# Patient Record
Sex: Female | Born: 1995 | Race: Black or African American | Hispanic: No | Marital: Single | Smoking: Never smoker
Health system: Southern US, Community
[De-identification: ages and names within clinical notes are randomized; demographics above are authoritative.]

---

## 2018-09-04 ENCOUNTER — Ambulatory Visit: Payer: Self-pay

## 2018-09-08 ENCOUNTER — Encounter: Payer: Medicaid Other | Admitting: Obstetrics and Gynecology

## 2018-09-10 ENCOUNTER — Telehealth: Payer: Self-pay | Admitting: Obstetrics and Gynecology

## 2018-09-10 NOTE — Telephone Encounter (Signed)
We received record for patient to est care. Patient was schedule 09/08/18 with [facility] and patient no showed appointment. I Called and left voicemail for patient to call back to be schedule. Paper records

## 2018-09-14 NOTE — Telephone Encounter (Signed)
Attempt to schedule appointment for patient. Patient wishes to call back to be schedule

## 2018-10-02 ENCOUNTER — Encounter: Payer: Self-pay | Admitting: Advanced Practice Midwife

## 2018-10-02 ENCOUNTER — Ambulatory Visit (INDEPENDENT_AMBULATORY_CARE_PROVIDER_SITE_OTHER): Payer: Medicaid Other | Admitting: Advanced Practice Midwife

## 2018-10-02 ENCOUNTER — Other Ambulatory Visit (HOSPITAL_COMMUNITY)
Admission: RE | Admit: 2018-10-02 | Discharge: 2018-10-02 | Disposition: A | Discharge status: Home / Self Care | Payer: Self-pay | Source: Ambulatory Visit | Attending: Advanced Practice Midwife | Admitting: Advanced Practice Midwife

## 2018-10-02 VITALS — BP 110/60 | Ht 64.0 in | Wt 166.0 lb

## 2018-10-02 DIAGNOSIS — Z113 Encounter for screening for infections with a predominantly sexual mode of transmission: Secondary | ICD-10-CM | POA: Diagnosis not present

## 2018-10-02 DIAGNOSIS — Z3202 Encounter for pregnancy test, result negative: Secondary | ICD-10-CM | POA: Diagnosis not present

## 2018-10-02 DIAGNOSIS — Z30013 Encounter for initial prescription of injectable contraceptive: Secondary | ICD-10-CM | POA: Diagnosis not present

## 2018-10-02 DIAGNOSIS — Z30431 Encounter for routine checking of intrauterine contraceptive device: Secondary | ICD-10-CM

## 2018-10-02 LAB — POCT URINE PREGNANCY: PREG TEST UR: NEGATIVE

## 2018-10-02 MED ORDER — MEDROXYPROGESTERONE ACETATE 150 MG/ML IM SUSP: 150.0000 mg | INTRAMUSCULAR | 3 refills | Status: DC

## 2018-10-02 MED ORDER — MISOPROSTOL 200 MCG PO TABS: ORAL_TABLET | ORAL | 0 refills | Status: DC

## 2018-10-02 NOTE — Progress Notes (Signed)
   Patient ID: Allison Mueller, female   DOB: 1996/07/10, 22 y.o.   MRN: [id number]  Reason for Visit: Contraception (depo)    Subjective:     HPI:  Allison Mueller is a 22 y.o. female G2 P1011 in the office for birth control consult and establishment of care. She has recently moved here from [place]. Previously she took birth control pills and became pregnant for a second time. She had that pregnancy terminated. She then started Depo although admits missing her most recent dose in October. She does not think she is pregnant at this time but has been sexually active without protection since missing her last dose of Depo. She admits an annual exam in the past few months. She admits STD testing in the last 2 years. Today she is counseled as to the risks/benefits of other methods of birth control including LARCS. She is interested in an IUD but will need to discuss this with her fiance prior to having the procedure. Rx sent for Depo in case she chooses that. Also sent Rx for cytotec to take prior to procedure if she chooses the IUD.   History reviewed. No pertinent past medical history. Family History  Problem Relation Age of Onset  . Cancer Paternal Uncle   . Breast cancer Maternal Grandmother    History reviewed. No pertinent surgical history.  Short Social History:  Social History   Tobacco Use  . Smoking status: Never Smoker  . Smokeless tobacco: Never Used  Substance Use Topics  . Alcohol use: Never    Frequency: Never    No Known Allergies  Current Outpatient Medications  Medication Sig Dispense Refill  . medroxyPROGESTERone (DEPO-PROVERA) 150 MG/ML injection Inject 1 mL (150 mg total) into the muscle every 3 (three) months. 1 mL 3  . misoprostol (CYTOTEC) 200 MCG tablet Place two tablets in between your gums and cheeks (one tablet on each side) 4 hours prior to procedure 2 tablet 0   No current facility-administered medications for this visit.     Review of Systems    Constitutional:  Constitutional negative. HENT: HENT negative.  Eyes: Eyes negative.  Respiratory: Respiratory negative.  Cardiovascular: Cardiovascular negative.  GI: Gastrointestinal negative.  GU: Genitourinary negative. Musculoskeletal: Musculoskeletal negative.  Skin: Skin negative.  Neurological: Neurological negative. Hematologic: Hematologic/lymphatic negative.  Psychiatric: Psychiatric negative.        Objective:  Objective   Vitals:   10/02/18 0954  BP: 110/60  Weight: 166 lb (75.3 kg)  Height: 5\' 4"  (1.626 m)   Body mass index is 28.49 kg/m.  Vital Signs: BP 110/60   Ht 5\' 4"  (1.626 m)   Wt 166 lb (75.3 kg)   BMI 28.49 kg/m  Constitutional: Well nourished, well developed female in no acute distress.  HEENT: normal Skin: Warm and dry.   Respiratory:  Normal respiratory effort Psych: Alert and Oriented x3. No memory deficits. Normal mood and affect.    Data: POCT urine pregnancy: negative     Assessment/Plan:     22 yo G2 P1011 female in the office for birth control consultation  Urine aptima sent Return to office in the next week for either Depo injection or IUD as desired   Tresea Mall CNM [facility], [facility]

## 2018-10-06 LAB — CERVICOVAGINAL ANCILLARY ONLY
CHLAMYDIA, DNA PROBE: POSITIVE — AB
NEISSERIA GONORRHEA: POSITIVE — AB
Trichomonas: NEGATIVE

## 2018-10-07 ENCOUNTER — Other Ambulatory Visit: Payer: Self-pay | Admitting: Advanced Practice Midwife

## 2018-10-07 DIAGNOSIS — A549 Gonococcal infection, unspecified: Secondary | ICD-10-CM

## 2018-10-07 DIAGNOSIS — A749 Chlamydial infection, unspecified: Secondary | ICD-10-CM

## 2018-10-07 MED ORDER — AZITHROMYCIN 500 MG PO TABS: 1000.0000 mg | ORAL_TABLET | Freq: Once | ORAL | 0 refills | Status: AC

## 2018-10-07 MED ORDER — CEFTRIAXONE SODIUM 250 MG IJ SOLR: 250.0000 mg | Freq: Once | INTRAMUSCULAR | Status: DC

## 2018-10-07 NOTE — Progress Notes (Signed)
Rx sent for Azithromycin to patient pharmacy. Rx ordered for clinic administered Rocephin. Patient notified. She has lost her medicaid card and will pick up her medicine and come to clinic for injection when she finds her card. She has decided to continue with Depo for birth control.

## 2018-10-19 ENCOUNTER — Telehealth: Payer: Self-pay

## 2018-10-19 NOTE — Telephone Encounter (Signed)
Pt called to sched appt for bc and was told she'd need a preg test. Please call.  She was just seen a couple weeks ago.  [phone]

## 2018-10-20 NOTE — Telephone Encounter (Signed)
Pt aware that we would still have to do a pregnancy test since she is late getting her depo injection, we have to do this to cover ourselves and document everything. Pt states she will try to come in December but cannot come yet since she would have to pay $16 for pregnancy test. I advised her we will still have to do preg test in December.

## 2019-04-19 ENCOUNTER — Emergency Department: Payer: Medicaid Other

## 2019-04-19 ENCOUNTER — Emergency Department
Admission: EM | Admit: 2019-04-19 | Discharge: 2019-04-19 | Disposition: A | Discharge status: Home / Self Care | Payer: Medicaid Other | Attending: Emergency Medicine | Admitting: Emergency Medicine

## 2019-04-19 ENCOUNTER — Encounter: Payer: Self-pay | Admitting: Emergency Medicine

## 2019-04-19 ENCOUNTER — Other Ambulatory Visit: Payer: Self-pay

## 2019-04-19 DIAGNOSIS — R109 Unspecified abdominal pain: Secondary | ICD-10-CM | POA: Diagnosis present

## 2019-04-19 DIAGNOSIS — Z79899 Other long term (current) drug therapy: Secondary | ICD-10-CM | POA: Insufficient documentation

## 2019-04-19 LAB — URINALYSIS, COMPLETE (UACMP) WITH MICROSCOPIC
Bacteria, UA: NONE SEEN
Bilirubin Urine: NEGATIVE
Glucose, UA: NEGATIVE mg/dL
Hgb urine dipstick: NEGATIVE
Ketones, ur: NEGATIVE mg/dL
Nitrite: NEGATIVE
Protein, ur: NEGATIVE mg/dL
Specific Gravity, Urine: 1.031 — ABNORMAL HIGH (ref 1.005–1.030)
pH: 5 (ref 5.0–8.0)

## 2019-04-19 LAB — COMPREHENSIVE METABOLIC PANEL
ALT: 41 U/L (ref 0–44)
AST: 36 U/L (ref 15–41)
Albumin: 3.8 g/dL (ref 3.5–5.0)
Alkaline Phosphatase: 84 U/L (ref 38–126)
Anion gap: 8 (ref 5–15)
BUN: 10 mg/dL (ref 6–20)
CO2: 22 mmol/L (ref 22–32)
Calcium: 9.1 mg/dL (ref 8.9–10.3)
Chloride: 108 mmol/L (ref 98–111)
Creatinine, Ser: 0.69 mg/dL (ref 0.44–1.00)
GFR calc Af Amer: >60 mL/min (ref >60)
GFR calc non Af Amer: >60 mL/min (ref >60)
Glucose, Bld: 118 mg/dL — ABNORMAL HIGH (ref 70–99)
Potassium: 3.7 mmol/L (ref 3.5–5.1)
Sodium: 138 mmol/L (ref 135–145)
Total Bilirubin: 0.8 mg/dL (ref 0.3–1.2)
Total Protein: 7.3 g/dL (ref 6.5–8.1)

## 2019-04-19 LAB — CBC WITH DIFFERENTIAL/PLATELET
Abs Immature Granulocytes: 0.04 10*3/uL (ref 0.00–0.07)
Basophils Absolute: 0 10*3/uL (ref 0.0–0.1)
Basophils Relative: 0 %
Eosinophils Absolute: 0.1 10*3/uL (ref 0.0–0.5)
Eosinophils Relative: 1 %
HCT: 37.9 % (ref 36.0–46.0)
Hemoglobin: 13.4 g/dL (ref 12.0–15.0)
Immature Granulocytes: 0 %
Lymphocytes Relative: 18 %
Lymphs Abs: 1.8 10*3/uL (ref 0.7–4.0)
MCH: 29 pg (ref 26.0–34.0)
MCHC: 35.4 g/dL (ref 30.0–36.0)
MCV: 82 fL (ref 80.0–100.0)
Monocytes Absolute: 0.5 10*3/uL (ref 0.1–1.0)
Monocytes Relative: 5 %
Neutro Abs: 7.4 10*3/uL (ref 1.7–7.7)
Neutrophils Relative %: 76 %
Platelets: 282 10*3/uL (ref 150–400)
RBC: 4.62 MIL/uL (ref 3.87–5.11)
RDW: 12.5 % (ref 11.5–15.5)
WBC: 9.9 10*3/uL (ref 4.0–10.5)
nRBC: 0 % (ref 0.0–0.2)

## 2019-04-19 LAB — POCT PREGNANCY, URINE: Preg Test, Ur: NEGATIVE

## 2019-04-19 LAB — LIPASE, BLOOD: Lipase: 25 U/L (ref 11–51)

## 2019-04-19 LAB — FIBRIN DERIVATIVES D-DIMER (ARMC ONLY): Fibrin derivatives D-dimer (ARMC): 474.48 ng/mL (FEU) (ref 0.00–499.00)

## 2019-04-19 MED ORDER — IBUPROFEN 600 MG PO TABS: 600.0000 mg | ORAL_TABLET | Freq: Four times a day (QID) | ORAL | 0 refills | Status: DC | PRN

## 2019-04-19 MED ORDER — CYCLOBENZAPRINE HCL 5 MG PO TABS: 5.0000 mg | ORAL_TABLET | Freq: Three times a day (TID) | ORAL | 0 refills | Status: DC | PRN

## 2019-04-19 NOTE — ED Triage Notes (Signed)
Pt reports pain to left flank when she breathes. Pt states started Saturday, denies all other sx'x.

## 2019-04-19 NOTE — ED Provider Notes (Signed)
[facility] Emergency Department Provider Note ____________________________________________   First MD Initiated Contact with Patient 04/19/19 [id number]     (approximate)  I have reviewed the triage vital signs and the nursing notes.   HISTORY  Chief Complaint Flank Pain    HPI Allison Mueller is a 23 y.o. female with no significant PMH who presents with left flank pain, gradual onset over the last 2 days, worse with certain positions, and occasionally radiating to her left shoulder.  She states that it is also worse when she takes a deep breath, but she does not feel short of breath.  She denies any associated nausea, vomiting, diarrhea, fever, cough, urinary symptoms, or any vaginal bleeding or discharge.  She states she had this pain once before and it resolved on its own.  History reviewed. No pertinent past medical history.  There are no active problems to display for this patient.   History reviewed. No pertinent surgical history.  Prior to Admission medications   Medication Sig Start Date End Date Taking? Authorizing Provider  cyclobenzaprine (FLEXERIL) 5 MG tablet Take 1 tablet (5 mg total) by mouth 3 (three) times daily as needed for muscle spasms. 04/19/19   Dionne BucySiadecki, Emmory Solivan, MD  ibuprofen (ADVIL) 600 MG tablet Take 1 tablet (600 mg total) by mouth every 6 (six) hours as needed. 04/19/19   Dionne BucySiadecki, Zanita Millman, MD  medroxyPROGESTERone (DEPO-PROVERA) 150 MG/ML injection Inject 1 mL (150 mg total) into the muscle every 3 (three) months. 10/02/18   Tresea MallGledhill, Jane, CNM  misoprostol (CYTOTEC) 200 MCG tablet Place two tablets in between your gums and cheeks (one tablet on each side) 4 hours prior to procedure 10/02/18   Tresea MallGledhill, Jane, CNM    Allergies Patient has no known allergies.  Family History  Problem Relation Age of Onset  . Cancer Paternal Uncle   . Breast cancer Maternal Grandmother     Social History Social History   Tobacco Use  . Smoking  status: Never Smoker  . Smokeless tobacco: Never Used  Substance Use Topics  . Alcohol use: Never    Frequency: Never  . Drug use: Never    Review of Systems  Constitutional: No fever. Eyes: No redness. ENT: No sore throat. Cardiovascular: Denies chest pain. Respiratory: Denies shortness of breath. Gastrointestinal: No vomiting or diarrhea.  Genitourinary: Negative for dysuria.  Musculoskeletal: Negative for back pain. Skin: Negative for rash. Neurological: Negative for headache.   ____________________________________________   PHYSICAL EXAM:  VITAL SIGNS: ED Triage Vitals  Enc Vitals Group     BP 04/19/19 0903 (!) 146/88     Pulse Rate 04/19/19 0903 (!) 109     Resp 04/19/19 0903 18     Temp 04/19/19 0903 99 F (37.2 C)     Temp Source 04/19/19 0903 Oral     SpO2 04/19/19 0903 100 %     Weight 04/19/19 0900 127 lb (57.6 kg)     Height 04/19/19 0900 5\' 7"  (1.702 m)     Head Circumference --      Peak Flow --      Pain Score 04/19/19 0900 7     Pain Loc --      Pain Edu? --      Excl. in [place]? --     Constitutional: Alert and oriented. Well appearing and in no acute distress. Eyes: Conjunctivae are normal.  Head: Atraumatic. Nose: No congestion/rhinnorhea. Mouth/Throat: Mucous membranes are moist.   Neck: Normal range of motion.  Cardiovascular:  Normal rate, regular rhythm. Good peripheral circulation. Respiratory: Normal respiratory effort.  No retractions.  Gastrointestinal: Soft and nontender. No distention.  Left lateral flank mild tenderness. Genitourinary: No CVA tenderness. Musculoskeletal: Extremities warm and well perfused.  Neurologic:  Normal speech and language. No gross focal neurologic deficits are appreciated.  Skin:  Skin is warm and dry. No rash noted. Psychiatric: Mood and affect are normal. Speech and behavior are normal.  ____________________________________________   LABS (all labs ordered are listed, but only abnormal results are  displayed)  Labs Reviewed  URINALYSIS, COMPLETE (UACMP) WITH MICROSCOPIC - Abnormal; Notable for the following components:      Result Value   Color, Urine YELLOW (*)    APPearance TURBID (*)    Specific Gravity, Urine 1.031 (*)    Leukocytes,Ua TRACE (*)    All other components within normal limits  COMPREHENSIVE METABOLIC PANEL - Abnormal; Notable for the following components:   Glucose, Bld 118 (*)    All other components within normal limits  CBC WITH DIFFERENTIAL/PLATELET  LIPASE, BLOOD  FIBRIN DERIVATIVES D-DIMER ([facility] ONLY)  POCT PREGNANCY, URINE   ____________________________________________  EKG   ____________________________________________  RADIOLOGY  CXR: No focal infiltrate or other acute abnormality  ____________________________________________   PROCEDURES  Procedure(s) performed: No  Procedures  Critical Care performed: No ____________________________________________   INITIAL IMPRESSION / ASSESSMENT AND PLAN / ED COURSE  Pertinent labs & imaging results that were available during my care of the patient were reviewed by me and considered in my medical decision making (see chart for details).  23 year old female with no significant PMH presents with left flank pain over the last 2 days, gradual in onset, worse with certain positions and with deep inspiration, and not associated with any other symptoms.  It occasionally radiates to her shoulder.  On exam, she is well-appearing.  Her vital signs are normal except for mild tachycardia at triage.  The pain is reproducible to palpation in the left lateral flank area, but the exam is otherwise unremarkable.  The abdomen is soft and nontender.  I suspect most likely musculoskeletal etiology given the reproducible nature of the pain and the lack of associated symptoms.  Differential also includes UTI/pyelonephritis, gastritis, or bronchitis.  I have a low suspicion for PE given the location of the pain and  the lack of respiratory symptoms or hypoxia.  Given the tachycardia at triage I will obtain a d-dimer.  We will obtain lab work-up, chest x-ray, and reassess.  ----------------------------------------- 11:45 AM on 04/19/2019 -----------------------------------------  The lab work-up is within normal limits including d-dimer.  UA shows some RBCs.  It is possible that the patient could have a small ureteral stone, or this may be an incidental finding.  The patient has not required any pain medication in the ED.  She is stable for discharge at this time.  I counseled her on the results of the work-up.  I will prescribe ibuprofen and a low-dose of Flexeril.  Return precautions given, and the patient expresses understanding. ____________________________________________   FINAL CLINICAL IMPRESSION(S) / ED DIAGNOSES  Final diagnoses:  Left flank pain      NEW MEDICATIONS STARTED DURING THIS VISIT:  New Prescriptions   CYCLOBENZAPRINE (FLEXERIL) 5 MG TABLET    Take 1 tablet (5 mg total) by mouth 3 (three) times daily as needed for muscle spasms.   IBUPROFEN (ADVIL) 600 MG TABLET    Take 1 tablet (600 mg total) by mouth every 6 (six) hours as needed.  Note:  This document was prepared using Dragon voice recognition software and may include unintentional dictation errors.    Dionne Bucy, MD 04/19/19 1146

## 2019-04-19 NOTE — Discharge Instructions (Signed)
Your pain could be caused by a small kidney stone, although it is also possibly muscular pain.  If it is a kidney stone it should resolve on its own within the next several days.  You can take the ibuprofen as the primary medicine for pain.  If you have additional pain or spasm, you can take the Flexeril only if needed.  Return to the ER for new, worsening, or persistent severe pain, problems urinating, fevers, vomiting, weakness, or any other new or worsening symptoms that concern you.

## 2019-04-19 NOTE — ED Notes (Signed)
Sent urine to lab

## 2019-07-09 ENCOUNTER — Ambulatory Visit: Payer: Medicaid Other

## 2019-07-27 ENCOUNTER — Ambulatory Visit: Payer: Medicaid Other

## 2019-07-28 ENCOUNTER — Encounter: Payer: Self-pay | Admitting: Family Medicine

## 2019-07-28 ENCOUNTER — Other Ambulatory Visit: Payer: Self-pay | Admitting: Family Medicine

## 2019-07-28 ENCOUNTER — Ambulatory Visit (LOCAL_COMMUNITY_HEALTH_CENTER): Payer: Medicaid Other | Admitting: Family Medicine

## 2019-07-28 ENCOUNTER — Other Ambulatory Visit: Payer: Self-pay

## 2019-07-28 VITALS — BP 122/75 | Ht 64.0 in | Wt 201.4 lb

## 2019-07-28 DIAGNOSIS — Z113 Encounter for screening for infections with a predominantly sexual mode of transmission: Secondary | ICD-10-CM

## 2019-07-28 DIAGNOSIS — Z202 Contact with and (suspected) exposure to infections with a predominantly sexual mode of transmission: Secondary | ICD-10-CM

## 2019-07-28 DIAGNOSIS — Z30013 Encounter for initial prescription of injectable contraceptive: Secondary | ICD-10-CM | POA: Diagnosis not present

## 2019-07-28 DIAGNOSIS — Z3042 Encounter for surveillance of injectable contraceptive: Secondary | ICD-10-CM

## 2019-07-28 DIAGNOSIS — Z3009 Encounter for other general counseling and advice on contraception: Secondary | ICD-10-CM | POA: Diagnosis not present

## 2019-07-28 LAB — WET PREP FOR TRICH, YEAST, CLUE
Trichomonas Exam: NEGATIVE
Yeast Exam: NEGATIVE

## 2019-07-28 MED ORDER — MEDROXYPROGESTERONE ACETATE 150 MG/ML IM SUSP
150.0000 mg | Freq: Once | INTRAMUSCULAR | Status: AC
  Administered 2019-07-28: 150 mg via INTRAMUSCULAR

## 2019-07-28 MED ORDER — CEFTRIAXONE SODIUM 250 MG IJ SOLR
250.0000 mg | Freq: Once | INTRAMUSCULAR | Status: AC
  Administered 2019-07-28: 250 mg via INTRAMUSCULAR

## 2019-07-28 MED ORDER — AZITHROMYCIN 500 MG PO TABS
1000.0000 mg | ORAL_TABLET | Freq: Once | ORAL | Status: AC
  Administered 2019-07-28: 1000 mg via ORAL

## 2019-07-28 NOTE — Progress Notes (Signed)
   [facility] problem visit  [place] Department  Subjective:  Allison Mueller is a 23 y.o. being seen today for   Chief Complaint  Patient presents with  . Contraception    Depo  . Exposure to STD    HPI  Client states that she was treated for GC/Chlamydia 09/2018.  She informed partner but he didn't receive treatment.  She states they broke up and didn't see each other until Jan/Feb, 2020.  He informed her recently that he didn't receive his STD treatment.  She is here for STD screening and re-treatment.  She denies any STD sympts.   Does the patient have a current or past history of drug use? No   No components found for: HCV]   Health Maintenance Due  Topic Date Due  . HIV Screening  10/28/2013  . TETANUS/TDAP  10/28/2017  . INFLUENZA VACCINE  06/26/2019    ROS  The following portions of the patient's history were reviewed and updated as appropriate: allergies, current medications, past family history, past medical history, past social history, past surgical history and problem list. Problem list updated.   See flowsheet for other program required questions.  Objective:   Vitals:   07/28/19 1445  BP: 122/75  Weight: 201 lb 6.4 oz (91.4 kg)  Height: 5\' 4"  (1.626 m)    Physical Exam Constitutional:      Appearance: She is obese.  HENT:     Mouth/Throat:     Mouth: Mucous membranes are moist.     Pharynx: Oropharynx is clear. No oropharyngeal exudate or posterior oropharyngeal erythema.  Neck:     Musculoskeletal: No muscular tenderness.  Abdominal:     Palpations: Abdomen is soft.     Tenderness: There is no abdominal tenderness.  Genitourinary:    Vagina: Vaginal discharge present.     Rectum: Normal.     Comments: thin , white disch no odor, pH 4.5 Lymphadenopathy:     Cervical: No cervical adenopathy.  Skin:    General: Skin is warm and dry.     Findings: No lesion or rash.  Neurological:     Mental Status: She is  alert.       Assessment and Plan:  Allison Mueller is a 23 y.o. female presenting to the [facility] Department for a Women's Health problem visit  1. Surveillance for Depo-Provera contraception Depo Provera 150 mg IM q 11-13 wks x 2   2. Screening examination for venereal disease  - WET PREP FOR Foley, YEAST, CLUE - Chlamydia/Gonorrhea [facility] - HIV [place] [facility] - Syphilis Serology, [facility] Reinfection of GC/CVhlamydia Treat- Ceftriaxone 250 mg IM and Azithromycin  1 gm po once. Partner is schedules for treatment this week in [place].  Co client no sexual activity x 1 wk after both she and partner complete treatment. Condoms 3. General counseling and advice on contraceptive management Due for Depo Provera today.    No follow-ups on file.  No future appointments.  Hassell Done, FNP

## 2019-07-28 NOTE — Progress Notes (Signed)
Patient given Depo today and Depo reminder card for next dose. Wet mount reviewed, no treatment indicated. Patient treated as contact to Chlamydia and Gonorrhea per provider orders. Jenetta Downer, RN

## 2019-07-28 NOTE — Progress Notes (Signed)
Patient here for Depo at 11 2/7 since last Depo. Patient also states she was treated for Gonorrhea and Chlamydia previously, but partner was never treated--states she needs to be retreated today. Also desires STD testing.Jenetta Downer, RN

## 2019-08-02 LAB — GONOCOCCUS CULTURE

## 2019-08-17 ENCOUNTER — Telehealth: Payer: Self-pay

## 2019-08-17 NOTE — Telephone Encounter (Signed)
TC to patient. Verified ID via password/SS#. Informed of positive chlamydia; patient tx's in office on 07/28/2019. Co to return in 2-3 months for TOC. Aileen Fass, RN

## 2019-08-19 NOTE — Addendum Note (Signed)
Addended by: Cletis Media on: 08/19/2019 12:41 PM   Modules accepted: Orders

## 2019-09-25 ENCOUNTER — Encounter: Payer: Self-pay | Admitting: Emergency Medicine

## 2019-09-25 ENCOUNTER — Other Ambulatory Visit: Payer: Self-pay

## 2019-09-25 ENCOUNTER — Emergency Department
Admission: EM | Admit: 2019-09-25 | Discharge: 2019-09-25 | Disposition: A | Discharge status: Home / Self Care | Payer: Medicaid Other | Attending: Emergency Medicine | Admitting: Emergency Medicine

## 2019-09-25 ENCOUNTER — Emergency Department: Payer: Medicaid Other

## 2019-09-25 DIAGNOSIS — M25561 Pain in right knee: Secondary | ICD-10-CM | POA: Diagnosis not present

## 2019-09-25 DIAGNOSIS — Z79899 Other long term (current) drug therapy: Secondary | ICD-10-CM | POA: Diagnosis not present

## 2019-09-25 NOTE — Discharge Instructions (Addendum)
Follow-up with Dr. Mack Guise at [facility] if not better in 3 to 5 days.  Apply ice to the knee.  Take 4 over-the-counter ibuprofens 3 times a day.  Return if worsening

## 2019-09-25 NOTE — ED Triage Notes (Signed)
L knee soreness with much use the past 2 weeks. Today noted swelling. Does not remember significant injury.

## 2019-09-25 NOTE — ED Provider Notes (Signed)
[facility] Emergency Department Provider Note  ____________________________________________   First MD Initiated Contact with Patient 09/25/19 1713     (approximate)  I have reviewed the triage vital signs and the nursing notes.   HISTORY  Chief Complaint Knee Pain    HPI Allison Mueller is a 23 y.o. female presents emergency department complaining of left knee pain.  She states that she has a history of fluid on the knee.  States it hurts to stand and bend.  She states it is been hurting for about 2 weeks and then noticed the swelling today.  No fever or chills.  No drainage from site.  No redness.    History reviewed. No pertinent past medical history.  There are no active problems to display for this patient.   History reviewed. No pertinent surgical history.  Prior to Admission medications   Medication Sig Start Date End Date Taking? Authorizing Provider  ibuprofen (ADVIL) 600 MG tablet Take 1 tablet (600 mg total) by mouth every 6 (six) hours as needed. 04/19/19   Arta Silence, MD  misoprostol (CYTOTEC) 200 MCG tablet Place two tablets in between your gums and cheeks (one tablet on each side) 4 hours prior to procedure Patient not taking: Reported on 07/28/2019 10/02/18 09/25/19  Rod Can, CNM    Allergies Patient has no known allergies.  Family History  Problem Relation Age of Onset  . Cancer Paternal Uncle   . Breast cancer Maternal Grandmother   . Diabetes Father   . Hypertension Father   . Hyperlipidemia Father     Social History Social History   Tobacco Use  . Smoking status: Never Smoker  . Smokeless tobacco: Never Used  Substance Use Topics  . Alcohol use: Never    Frequency: Never  . Drug use: Never    Review of Systems  Constitutional: No fever/chills Eyes: No visual changes. ENT: No sore throat. Respiratory: Denies cough Genitourinary: Negative for dysuria. Musculoskeletal: Negative for back pain.   Positive for left knee pain Skin: Negative for rash.    ____________________________________________   PHYSICAL EXAM:  VITAL SIGNS: ED Triage Vitals  Enc Vitals Group     BP 09/25/19 1642 140/88     Pulse Rate 09/25/19 1642 100     Resp 09/25/19 1642 20     Temp 09/25/19 1642 98.9 F (37.2 C)     Temp Source 09/25/19 1642 Oral     SpO2 09/25/19 1642 98 %     Weight 09/25/19 1643 200 lb (90.7 kg)     Height 09/25/19 1643 5\' 4"  (1.626 m)     Head Circumference --      Peak Flow --      Pain Score 09/25/19 1642 0     Pain Loc --      Pain Edu? --      Excl. in [place]? --     Constitutional: Alert and oriented. Well appearing and in no acute distress. Eyes: Conjunctivae are normal.  Head: Atraumatic. Nose: No congestion/rhinnorhea. Mouth/Throat: Mucous membranes are moist.   Cardiovascular: Normal rate, regular rhythm. Respiratory: Normal respiratory effort.  No retractions,  GU: deferred Musculoskeletal: FROM all extremities, warm and well perfused, left knee is tender at the suprapatellar bursa and posteriorly, neurovascular is intact, no calf tenderness is noted. Neurologic:  Normal speech and language.  Skin:  Skin is warm, dry and intact. No rash noted. Psychiatric: Mood and affect are normal. Speech and behavior are normal.  ____________________________________________  LABS (all labs ordered are listed, but only abnormal results are displayed)  Labs Reviewed - No data to display ____________________________________________   ____________________________________________  RADIOLOGY  X-ray of the left knee is negative for effusion  ____________________________________________   PROCEDURES  Procedure(s) performed: Knee immobilizer applied by the tech   Procedures    ____________________________________________   INITIAL IMPRESSION / ASSESSMENT AND PLAN / ED COURSE  Pertinent labs & imaging results that were available during my care of the patient  were reviewed by me and considered in my medical decision making (see chart for details).   Patient is 23 year old female presents emergency department with complaints of left knee pain.  See HPI  Physical exam shows patient to appear well.  Minimal swelling noted to the left knee  X-ray of the left knee is negative  Explained findings to the patient.  She is placed in knee immobilizer.  She is to go counter ibuprofen.  Apply ice to the left knee.  Follow-up with orthopedics if not better in 5 to 7 days.  States she understands will comply patient discharged stable condition.    Allison Mueller was evaluated in Emergency Department on 09/25/2019 for the symptoms described in the history of present illness. She was evaluated in the context of the global COVID-19 pandemic, which necessitated consideration that the patient might be at risk for infection with the SARS-CoV-2 virus that causes COVID-19. Institutional protocols and algorithms that pertain to the evaluation of patients at risk for COVID-19 are in a state of rapid change based on information released by regulatory bodies including the CDC and federal and state organizations. These policies and algorithms were followed during the patient's care in the ED.   As part of my medical decision making, I reviewed the following data within the electronic MEDICAL RECORD NUMBER Nursing notes reviewed and incorporated, Old chart reviewed, Radiograph reviewed x-ray left knee is negative, Notes from prior ED visits and [place] Controlled Substance Database  ____________________________________________   FINAL CLINICAL IMPRESSION(S) / ED DIAGNOSES  Final diagnoses:  Acute pain of right knee      NEW MEDICATIONS STARTED DURING THIS VISIT:  Discharge Medication List as of 09/25/2019  6:20 PM       Note:  This document was prepared using Dragon voice recognition software and may include unintentional dictation errors.    Faythe Ghee, PA-C 09/25/19  1854    Concha Se, MD 09/26/19 [id number]

## 2019-10-14 ENCOUNTER — Other Ambulatory Visit: Payer: Self-pay

## 2019-10-14 ENCOUNTER — Ambulatory Visit (LOCAL_COMMUNITY_HEALTH_CENTER): Payer: Medicaid Other

## 2019-10-14 ENCOUNTER — Ambulatory Visit: Payer: Medicaid Other

## 2019-10-14 VITALS — BP 119/82 | Ht 64.0 in | Wt 207.0 lb

## 2019-10-14 DIAGNOSIS — Z3009 Encounter for other general counseling and advice on contraception: Secondary | ICD-10-CM | POA: Diagnosis not present

## 2019-10-14 DIAGNOSIS — Z30013 Encounter for initial prescription of injectable contraceptive: Secondary | ICD-10-CM

## 2019-10-14 DIAGNOSIS — Z3042 Encounter for surveillance of injectable contraceptive: Secondary | ICD-10-CM

## 2019-10-14 MED ORDER — MEDROXYPROGESTERONE ACETATE 150 MG/ML IM SUSP
150.0000 mg | Freq: Once | INTRAMUSCULAR | Status: AC
  Administered 2019-10-14: 17:00:00 150 mg via INTRAMUSCULAR

## 2019-10-15 NOTE — Progress Notes (Signed)
Last depo at [facility] was 07/28/2019; 11.1 weeks post depo on 10/14/2019.   DMPA 150 mg IM administered on 10/14/2019 per Hassell Done, FNP order dated 07/28/2019. Pt to schedule physical/problem visit to see provider when next depo is due.

## 2019-11-05 DIAGNOSIS — M25562 Pain in left knee: Secondary | ICD-10-CM | POA: Insufficient documentation

## 2020-01-11 ENCOUNTER — Other Ambulatory Visit: Payer: Self-pay | Admitting: Physician Assistant

## 2020-01-11 DIAGNOSIS — Z3042 Encounter for surveillance of injectable contraceptive: Secondary | ICD-10-CM

## 2020-01-11 MED ORDER — MEDROXYPROGESTERONE ACETATE 150 MG/ML IM SUSP
150.0000 mg | Freq: Once | INTRAMUSCULAR | Status: AC
  Administered 2020-01-12: 10:00:00 150 mg via INTRAMUSCULAR

## 2020-01-11 NOTE — Progress Notes (Signed)
Per chart, last RP was 10/2018.  CBE due 2023 and pap due 2020.  Provided that patient desires to continue Depo and BP is normal, ok for Depo on 01/12/2020.  Patient will need provider visit in May 2021, for pap and Depo.

## 2020-01-12 ENCOUNTER — Ambulatory Visit (LOCAL_COMMUNITY_HEALTH_CENTER): Payer: Medicaid Other

## 2020-01-12 ENCOUNTER — Other Ambulatory Visit: Payer: Self-pay

## 2020-01-12 VITALS — BP 128/73 | Ht 64.0 in | Wt 214.0 lb

## 2020-01-12 DIAGNOSIS — Z3009 Encounter for other general counseling and advice on contraception: Secondary | ICD-10-CM | POA: Diagnosis not present

## 2020-01-12 DIAGNOSIS — Z3042 Encounter for surveillance of injectable contraceptive: Secondary | ICD-10-CM

## 2020-01-12 DIAGNOSIS — Z30013 Encounter for initial prescription of injectable contraceptive: Secondary | ICD-10-CM | POA: Diagnosis not present

## 2020-01-12 NOTE — Progress Notes (Signed)
Depo given (R Delt) per [place] PA order; tolerated well. Instructed will need provider visit when she returns in May for Depo. Richmond Campbell, RN

## 2020-08-04 ENCOUNTER — Ambulatory Visit: Payer: Medicaid Other | Admitting: Family Medicine

## 2020-08-04 ENCOUNTER — Encounter: Payer: Self-pay | Admitting: Family Medicine

## 2020-08-04 ENCOUNTER — Other Ambulatory Visit: Payer: Self-pay

## 2020-08-04 VITALS — BP 103/73 | HR 88 | Ht 64.0 in | Wt 223.6 lb

## 2020-08-04 DIAGNOSIS — Z1331 Encounter for screening for depression: Secondary | ICD-10-CM

## 2020-08-04 DIAGNOSIS — Z3009 Encounter for other general counseling and advice on contraception: Secondary | ICD-10-CM | POA: Diagnosis not present

## 2020-08-04 DIAGNOSIS — Z01419 Encounter for gynecological examination (general) (routine) without abnormal findings: Secondary | ICD-10-CM

## 2020-08-04 LAB — PREGNANCY, URINE: Preg Test, Ur: NEGATIVE

## 2020-08-04 LAB — WET PREP FOR TRICH, YEAST, CLUE
Trichomonas Exam: NEGATIVE
Yeast Exam: NEGATIVE

## 2020-08-04 MED ORDER — NORGESTIM-ETH ESTRAD TRIPHASIC 0.18/0.215/0.25 MG-25 MCG PO TABS: 1.0000 | ORAL_TABLET | Freq: Every day | ORAL | 3 refills | Status: DC

## 2020-08-04 NOTE — Progress Notes (Signed)
In for physical exam, PAP, and birth control pills. Desires STD screen and blood work today. Results reviewed with provider, no treatment indicated per standing order. Patient instructed to retrieve birth control pills from pharmacy. Sharlyne Pacas, RN

## 2020-08-04 NOTE — Progress Notes (Signed)
[facility] [address] [place], [place] [address] Main Number: [phone]  Family Planning Visit- Initial Visit  Subjective:  Allison Mueller is a 24 y.o.  G2P1011  being seen today for an initial well woman visit and to discuss family planning options. Patient reports they do not want a pregnancy in the next year.   Chief Complaint  Patient presents with  . Annual Exam  . Contraception    Pills    Pt has Pain in left knee on their problem list.   HPI  Patient reports here for annual physical exam and to start OCPs.   Pt does not meet any of the following contraindications to estrogen use: -Age ?35 years and smoking ?15 cigarettes per day -Migraine with aura -Two or more RF for arterial CVD (such as older age, smoking, diabetes, and hypertension) -HTN -Breast cancer -VTE hx or acute event -Known thrombogenic mutations -Known ischemic heart disease -History of stroke -Complicated valvular heart disease (pulmonary HTN, risk for afib, hx subacute bacterial endocarditis) -Cirrhosis, Hepatocellular adenoma or malignant hepatoma   BP Readings from Last 3 Encounters:  08/04/20 103/73  01/12/20 128/73  10/14/19 119/82     Patient's last menstrual period was 07/09/2020 (approximate). Last sex: 1 month ago = 8/10 BCM: none Pt desires EC? n/a  Last pap per pt/review of record: never Last HIV test per pt/review of record: 1 yr ago Last tetanus vaccine: unsure  Last breast exam: never Personal/family hx breast cancer? Yes - MGM age 47  Patient reports 2 partner(s) in last year. Do they desire STI screening (if no, why not)? yes  Does the patient desire a pregnancy in the next year? no   24 y.o., Body mass index is 38.38 kg/m. - Is patient eligible for HA1C diabetes screening based on BMI and age >12?  no  HCV screening;       Has patient been screened once for HCV in the past?  no  No results found for: HCVAB       Does the patient have current drug use, have a partner with drug use, and/or has been incarcerated since last result? yes If yes-- Screen for HCV through [facility]   Does the patient meet criteria for HBV testing? yes Criteria:  -Household, sexual or needle sharing contact with HBV -History of drug use -HIV positive -Those with known Hep C  PHQ-9 score: 6   See flowsheet for other program required questions.   Health Maintenance Due  Topic Date Due  . Hepatitis C Screening  Never done  . COVID-19 Vaccine (1) Never done  . HIV Screening  Never done  . TETANUS/TDAP  Never done  . CHLAMYDIA SCREENING  10/03/2019  . PAP-Cervical Cytology Screening  Never done  . PAP SMEAR-Modifier  Never done  . INFLUENZA VACCINE  Never done    ROS 10 point review of systems is otherwise negative except as mentioned in HPI and listed below: Headache: 2x/mo, describes as general head pain, takes ibuprofen with good relief.   The following portions of the patient's history were reviewed and updated as appropriate: allergies, current medications, past family history, past medical history, past social history, past surgical history and problem list. Problem list updated.   See flowsheet for other program required questions.  Objective:   Vitals:   08/04/20 1338 08/04/20 1500  BP: 140/75 103/73  Pulse:  88  Weight: 223 lb 9.6 oz (101.4 kg)   Height:  5\' 4"  (1.626 m)     Physical Exam Vitals and nursing note reviewed.  Constitutional:      Appearance: Normal appearance.  HENT:     Head: Normocephalic and atraumatic.     Mouth/Throat:     Mouth: Mucous membranes are moist.     Pharynx: Oropharynx is clear. No oropharyngeal exudate or posterior oropharyngeal erythema.  Eyes:     Conjunctiva/sclera: Conjunctivae normal.  Neck:     Thyroid: No thyroid mass, thyromegaly or thyroid tenderness.  Cardiovascular:     Rate and Rhythm: Normal rate and regular rhythm.     Pulses: Normal  pulses.     Heart sounds: Normal heart sounds.  Pulmonary:     Effort: Pulmonary effort is normal.     Breath sounds: Normal breath sounds.  Chest:     Breasts:        Right: Normal. No swelling, mass, nipple discharge, skin change or tenderness.        Left: Normal. No swelling, mass, nipple discharge, skin change or tenderness.  Abdominal:     General: Abdomen is flat.     Palpations: There is no mass.     Tenderness: There is no abdominal tenderness. There is no rebound.  Genitourinary:    General: Normal vulva.     Exam position: Lithotomy position.     Pubic Area: No rash or pubic lice.      Labia:        Right: No rash or lesion.        Left: No rash or lesion.      Vagina: Normal. No vaginal erythema, bleeding or lesions. Vaginal discharge: scant, white, ph<4.5    Cervix: No cervical motion tenderness, discharge, friability, lesion or erythema.     Uterus: Normal.      Adnexa: Right adnexa normal and left adnexa normal.     Rectum: Normal.  Lymphadenopathy:     Head:     Right side of head: No preauricular or posterior auricular adenopathy.     Left side of head: No preauricular or posterior auricular adenopathy.     Cervical: No cervical adenopathy.     Upper Body:     Right upper body: No supraclavicular or axillary adenopathy.     Left upper body: No supraclavicular or axillary adenopathy.     Lower Body: No right inguinal adenopathy. No left inguinal adenopathy.  Skin:    General: Skin is warm and dry.     Findings: No rash.  Neurological:     Mental Status: She is alert and oriented to person, place, and time.      Assessment and Plan:  Allison Mueller is a 24 y.o. female presenting to the [facility] Department for an initial well woman exam/family planning visit.  Contraception counseling: Reviewed all forms of birth control options in the tiered based approach. available including abstinence; over the counter/barrier methods; hormonal contraceptive  medication including pill, patch, ring, injection,contraceptive implant, ECP; hormonal and nonhormonal IUDs; permanent sterilization options including vasectomy and the various tubal sterilization modalities. Risks, benefits, and typical effectiveness rates were reviewed.  Questions were answered.  Written information was also given to the patient to review.  Patient desires OCP, this was prescribed for patient. She will follow up in  1 year for surveillance.  She was told to call with any further questions, or with any concerns about this method of contraception.  Emphasized use of condoms 100% of the time for  STI prevention.  Emergency Contraception: n/a  1. Well woman exam -BCM: OCP x 1 yr rx to pharmacy. BP today wnl upon recheck, no recent prior records of elevated BP. Advised to repeat BP check at [facility] or in community in 2 wks to be sure WNL on OCP -Pap: done today -CBE: done today. "Active FYIs" info up to date. Recommended screening mammograms beginning at age 54-50. -STI screening: done today -PHQ-9 score = 6, referred to AM -Hepatitis B/C screening: done today, pt accepts - WET PREP FOR TRICH, YEAST, CLUE - Pregnancy, urine - Chlamydia/Gonorrhea [place] [facility] - HBV Antigen/Antibody [facility] - HIV/HCV [place] [facility] - Syphilis Serology, [place] [facility] - Pap IG (Image Guided) - Norgestimate-Ethinyl Estradiol Triphasic (TRI-LO-SPRINTEC) 0.18/0.215/0.25 MG-25 MCG tab; Take 1 tablet by mouth daily.  Dispense: 84 tablet; Refill: 3  2. Positive depression screening -PHQ-9 score of 6, pt would like referral to behavioral health.  - Ambulatory referral to Behavioral Health     Return in about 1 year (around 08/04/2021) for yearly wellness exam.  No future appointments.  Ann Held, PA-C

## 2020-08-08 LAB — PAP IG (IMAGE GUIDED): PAP Smear Comment: 0

## 2020-08-09 LAB — HEPATITIS B SURFACE ANTIGEN

## 2020-08-11 ENCOUNTER — Encounter: Payer: Self-pay | Admitting: Family Medicine

## 2020-08-11 DIAGNOSIS — R87619 Unspecified abnormal cytological findings in specimens from cervix uteri: Secondary | ICD-10-CM

## 2020-08-11 HISTORY — DX: Unspecified abnormal cytological findings in specimens from cervix uteri: R87.619

## 2020-08-11 LAB — HM HIV SCREENING LAB: HM HIV Screening: NEGATIVE

## 2020-08-11 LAB — HM HEPATITIS C SCREENING LAB: HM Hepatitis Screen: NEGATIVE

## 2020-08-14 ENCOUNTER — Encounter: Payer: Self-pay | Admitting: Family Medicine

## 2020-08-21 ENCOUNTER — Ambulatory Visit: Payer: Medicaid Other | Admitting: Licensed Clinical Social Worker

## 2021-03-14 IMAGING — DX DG KNEE COMPLETE 4+V*L*
4 series · 4 of 4 positions shown · non-contrast
Comparison: None.

CLINICAL DATA: Pain and swelling without trauma.

EXAM:
LEFT KNEE - COMPLETE 4+ VIEW

[knee ap]
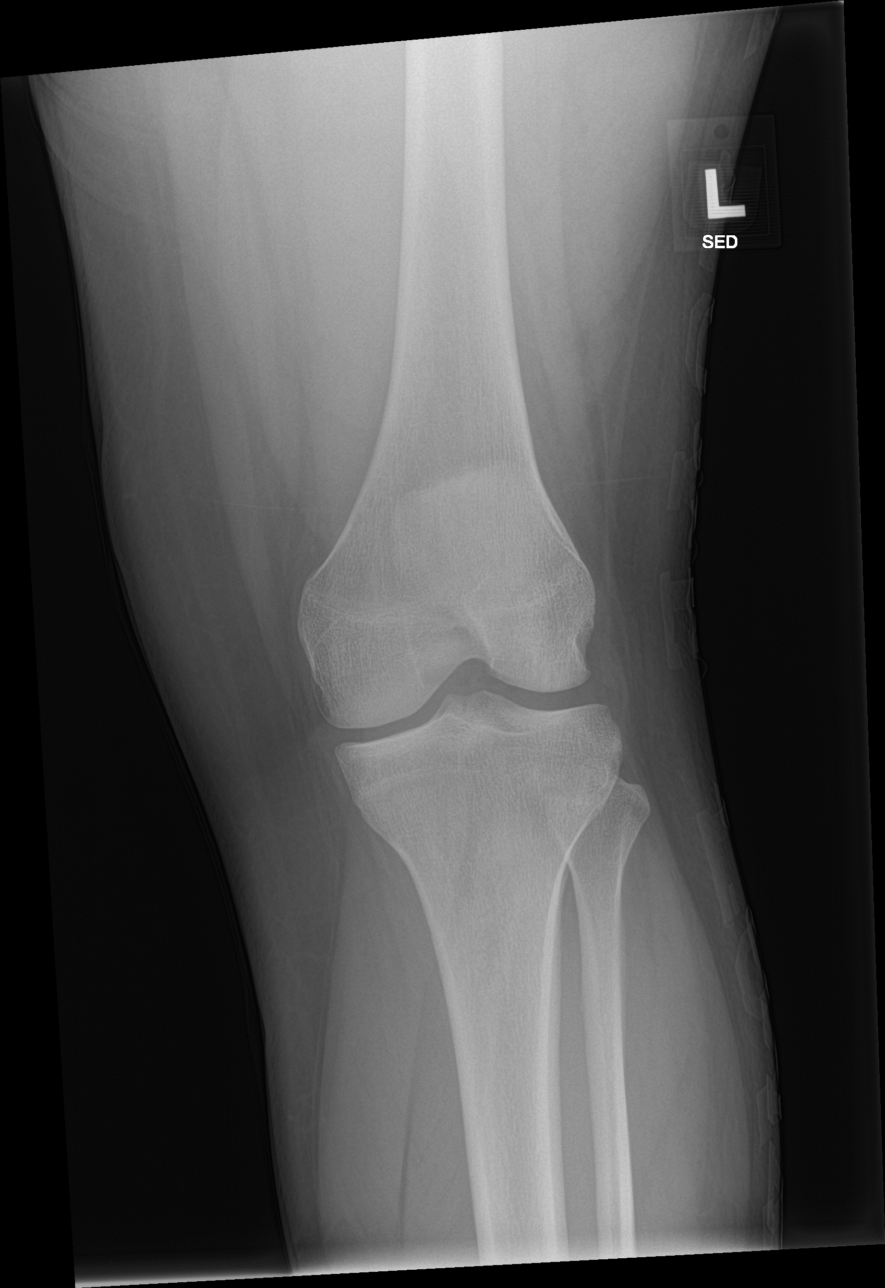

[knee tunnel]
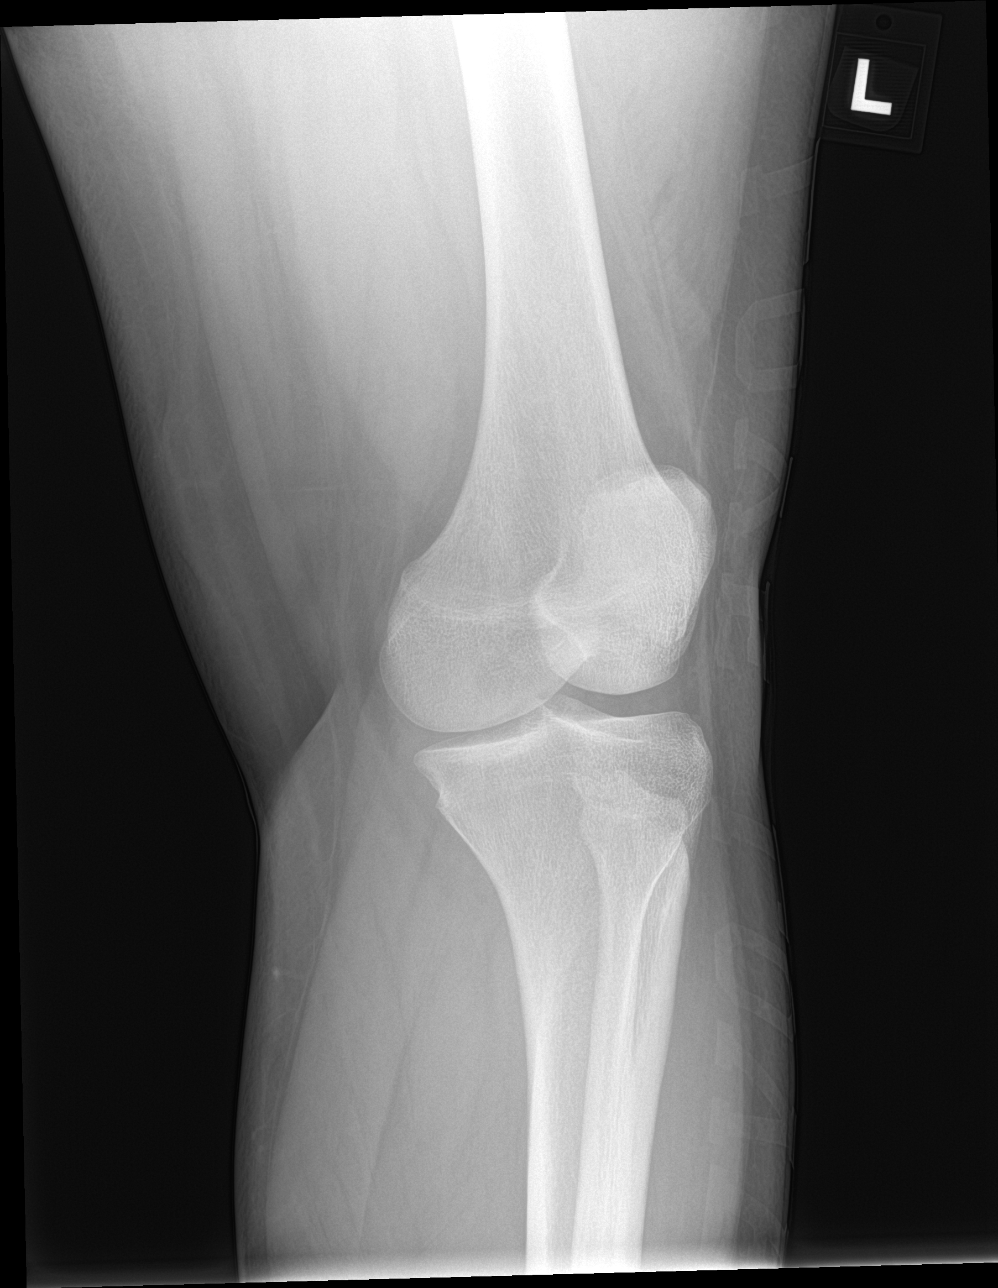

[knee lat]
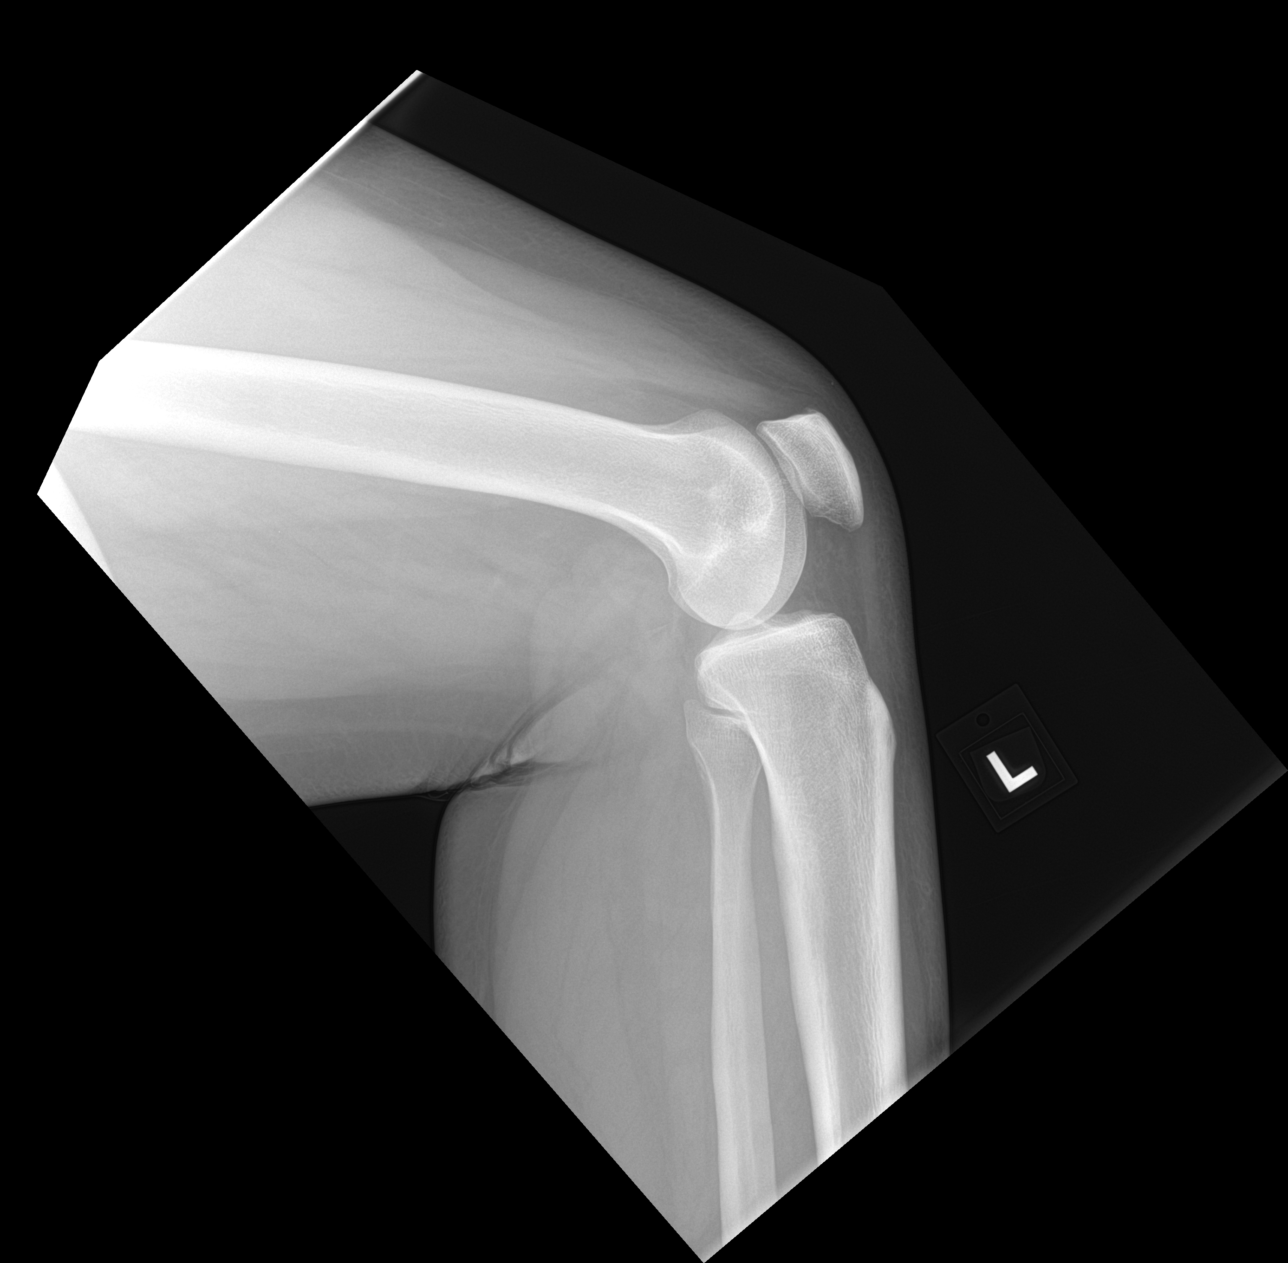

[knee obl]
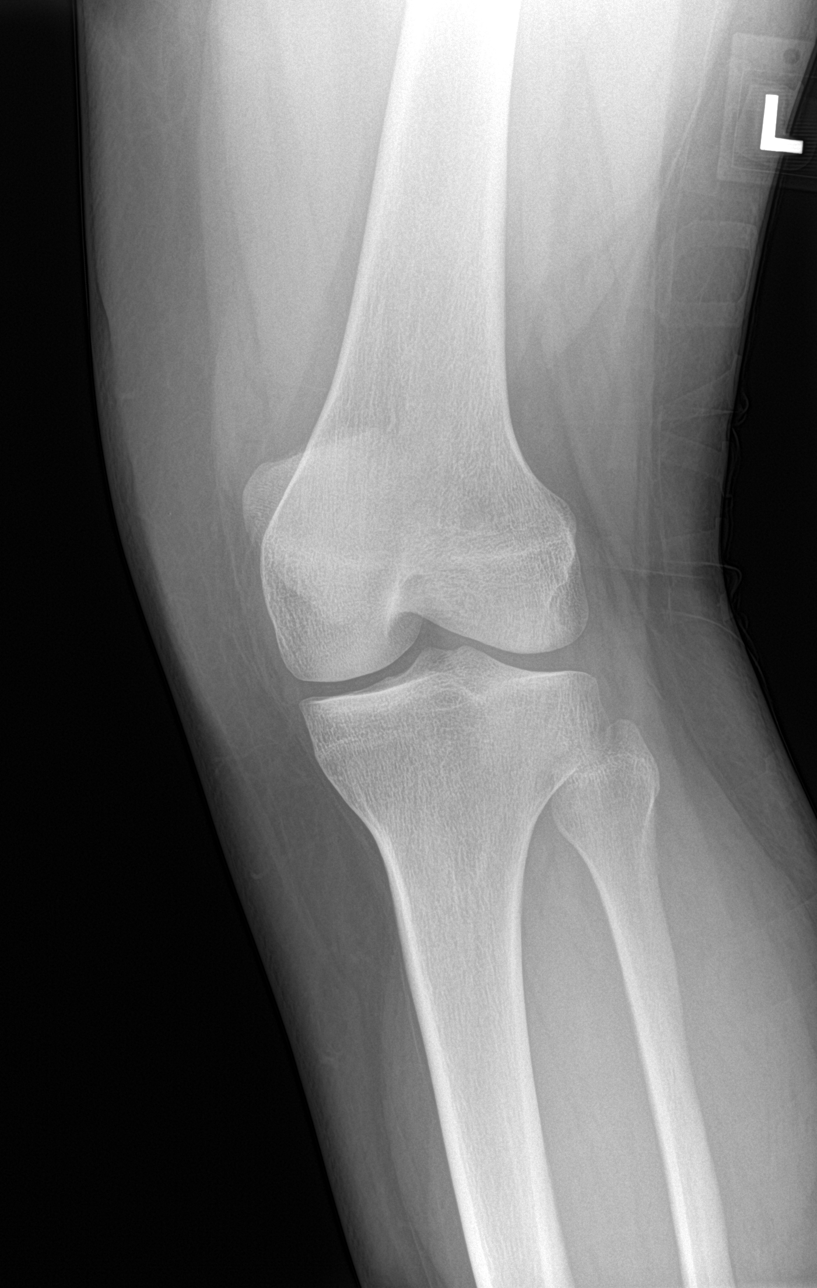

[4 of 4 positions shown; findings below may reference images not displayed]

FINDINGS: No evidence of fracture, dislocation, or joint effusion. No evidence
of arthropathy or other focal bone abnormality. Soft tissues are
unremarkable.
IMPRESSION: Negative.

## 2021-07-24 ENCOUNTER — Ambulatory Visit: Payer: Medicaid Other

## 2021-07-24 ENCOUNTER — Other Ambulatory Visit: Payer: Self-pay

## 2021-08-06 ENCOUNTER — Ambulatory Visit: Payer: Medicaid Other

## 2021-08-09 ENCOUNTER — Encounter: Payer: Self-pay | Admitting: Nurse Practitioner

## 2021-08-09 NOTE — Progress Notes (Addendum)
Telephone call to patient regarding PAP follow up.  No answer, left message to call. Chantella Creech, RN  

## 2021-10-17 ENCOUNTER — Telehealth: Payer: Self-pay

## 2021-10-17 NOTE — Telephone Encounter (Signed)
Telephone call to patient today regarding the need for a repeat PAP due 07-2021.  Phone number has been disconnected.  PAP Letter mailed today. No response to message left by Glenna Fellows, NP on 08/09/2021. Hart Carwin, RN

## 2021-11-21 ENCOUNTER — Ambulatory Visit: Payer: Medicaid Other

## 2021-11-28 ENCOUNTER — Ambulatory Visit: Payer: Medicaid Other

## 2021-12-13 ENCOUNTER — Ambulatory Visit (LOCAL_COMMUNITY_HEALTH_CENTER): Payer: Medicaid Other | Admitting: Family Medicine

## 2021-12-13 ENCOUNTER — Other Ambulatory Visit: Payer: Self-pay

## 2021-12-13 ENCOUNTER — Encounter: Payer: Self-pay | Admitting: Family Medicine

## 2021-12-13 VITALS — BP 114/78 | Ht 64.0 in | Wt 217.6 lb

## 2021-12-13 DIAGNOSIS — Z30011 Encounter for initial prescription of contraceptive pills: Secondary | ICD-10-CM

## 2021-12-13 DIAGNOSIS — Z8742 Personal history of other diseases of the female genital tract: Secondary | ICD-10-CM

## 2021-12-13 DIAGNOSIS — Z1272 Encounter for screening for malignant neoplasm of vagina: Secondary | ICD-10-CM

## 2021-12-13 DIAGNOSIS — Z3009 Encounter for other general counseling and advice on contraception: Secondary | ICD-10-CM | POA: Diagnosis not present

## 2021-12-13 DIAGNOSIS — Z713 Dietary counseling and surveillance: Secondary | ICD-10-CM

## 2021-12-13 DIAGNOSIS — Z01419 Encounter for gynecological examination (general) (routine) without abnormal findings: Secondary | ICD-10-CM | POA: Diagnosis not present

## 2021-12-13 DIAGNOSIS — Z309 Encounter for contraceptive management, unspecified: Secondary | ICD-10-CM

## 2021-12-13 DIAGNOSIS — Z113 Encounter for screening for infections with a predominantly sexual mode of transmission: Secondary | ICD-10-CM

## 2021-12-13 LAB — WET PREP FOR TRICH, YEAST, CLUE
Trichomonas Exam: NEGATIVE
Yeast Exam: NEGATIVE

## 2021-12-13 MED ORDER — NORGESTIM-ETH ESTRAD TRIPHASIC 0.18/0.215/0.25 MG-25 MCG PO TABS: 1.0000 | ORAL_TABLET | Freq: Every day | ORAL | 13 refills | Status: AC

## 2021-12-13 NOTE — Progress Notes (Signed)
[facility] Number: [phone]  Family Planning Visit- Repeat Yearly Visit  Subjective:  Allison Mueller is a 26 y.o. G2P1011  being seen today for an annual wellness visit and to discuss contraception options.   The patient is currently using No Method - No Contraceptive Precautions for pregnancy prevention. Patient does not want a pregnancy in the next year. Patient has the following medical problems: has Pain in left knee and Abnormal Pap smear of cervix on their problem list.  Chief Complaint  Patient presents with   Annual Exam    Patient reports here for PE and wants to start back on OCPs  Patient denies any problems    See flowsheet for other program required questions.   Body mass index is 37.35 kg/m. - Patient is eligible for diabetes screening based on BMI and age >88?  no HA1C ordered? no  Patient reports 1 of partners in last year. Desires STI screening?  Yes   Has patient been screened once for HCV in the past?  No  No results found for: HCVAB  Does the patient have current of drug use, have a partner with drug use, and/or has been incarcerated since last result? Yes  If yes-- Screen for HCV through [facility]   Does the patient meet criteria for HBV testing? Yes  Criteria:  -Household, sexual or needle sharing contact with HBV -History of drug use -HIV positive -Those with known Hep C   Health Maintenance Due  Topic Date Due   COVID-19 Vaccine (1) Never done   HPV VACCINES (1 - 2-dose series) Never done   TETANUS/TDAP  Never done   CHLAMYDIA SCREENING  10/03/2019   INFLUENZA VACCINE  Never done   PAP SMEAR-Modifier  08/04/2021    Review of Systems  Constitutional:  Negative for chills, fever, malaise/fatigue and weight loss.  HENT:  Negative for congestion, hearing loss and sore throat.   Eyes:  Negative for blurred vision, double vision and photophobia.  Respiratory:   Negative for shortness of breath.   Cardiovascular:  Negative for chest pain.  Gastrointestinal:  Negative for abdominal pain, blood in stool, constipation, diarrhea, heartburn, nausea and vomiting.  Genitourinary:  Negative for dysuria and frequency.  Musculoskeletal:  Negative for back pain, joint pain and neck pain.  Skin:  Negative for itching and rash.  Neurological:  Negative for dizziness, weakness and headaches.  Endo/Heme/Allergies:  Does not bruise/bleed easily.  Psychiatric/Behavioral:  Negative for depression, substance abuse and suicidal ideas.    The following portions of the patient's history were reviewed and updated as appropriate: allergies, current medications, past family history, past medical history, past social history, past surgical history and problem list. Problem list updated.  Objective:   Vitals:   12/13/21 0856  BP: 114/78  Weight: 217 lb 9.6 oz (98.7 kg)  Height: 5\' 4"  (1.626 m)    Physical Exam Vitals and nursing note reviewed.  Constitutional:      Appearance: Normal appearance. She is obese.  HENT:     Head: Normocephalic and atraumatic.     Mouth/Throat:     Mouth: Mucous membranes are moist.     Dentition: Normal dentition. No dental caries.     Pharynx: No oropharyngeal exudate or posterior oropharyngeal erythema.  Eyes:     General: No scleral icterus. Neck:     Thyroid: No thyroid mass, thyromegaly or thyroid tenderness.  Cardiovascular:  Rate and Rhythm: Normal rate and regular rhythm.     Pulses: Normal pulses.     Heart sounds: Normal heart sounds.  Pulmonary:     Effort: Pulmonary effort is normal.     Breath sounds: Normal breath sounds.  Chest:  Breasts:    Tanner Score is 5.     Comments: Breasts:        Right: Normal. No swelling, mass, nipple discharge, skin change or tenderness.        Left: Normal. No swelling, mass, nipple discharge, skin change or tenderness.   Abdominal:     General: Abdomen is flat. Bowel  sounds are normal.     Palpations: Abdomen is soft.  Genitourinary:    General: Normal vulva.     Rectum: Normal.     Comments: External genitalia without, lice, nits, erythema, edema , lesions or inguinal adenopathy. Vagina with normal mucosa and discharge and pH equals 4.  Cervix without visual lesions, uterus firm, mobile, non-tender, no masses, CMT adnexal fullness or tenderness.  Easy bleeding cervix with brush during endocervical portion of pap.   Musculoskeletal:        General: Normal range of motion.     Cervical back: Normal range of motion and neck supple.  Skin:    General: Skin is warm and dry.  Neurological:     General: No focal deficit present.     Mental Status: She is alert.  Psychiatric:        Mood and Affect: Mood normal.        Behavior: Behavior normal.      Assessment and Plan:  Allison Mueller is a 26 y.o. female [id number] presenting to the [facility] Department for an yearly wellness and contraception visit    1. Smear, vaginal, as part of routine gynecological examination Well woman exam Pap today  CBE today   - IGP, rfx Aptima HPV ASCU  2. Screening examination for venereal disease Patient accepted all screenings including wet prep, vaginal CT/GC and declined bloodwork for HIV/RPR.   Wet prep results neg    NO reatment needed  Discussed time line for State Lab results and that patient will be called with positive results and encouraged patient to call if she had not heard in 2 weeks.  Counseled to return or seek care for continued or worsening symptoms Recommended condom use with all sex  - WET PREP FOR [place], YEAST, [place] [facility]  3. Family planning services Contraception counseling: Reviewed all forms of birth control options in the tiered based approach. available including abstinence; over the counter/barrier methods; hormonal contraceptive medication including pill, patch, ring, injection,contraceptive  implant, ECP; hormonal and nonhormonal IUDs; permanent sterilization options including vasectomy and the various tubal sterilization modalities. Risks, benefits, and typical effectiveness rates were reviewed.  Questions were answered.  Written information was also given to the patient to review.  Patient desires OCP, this was prescribed for patient. She will follow up in  as needed  for surveillance.    was told to call with any further questions, or with any concerns about this method of contraception.  Emphasized use of condoms 100% of the time for STI prevention.  Patient was not offered ECP based on last sex    4. Encounter for initial prescription of contraceptive pills RX sent to listed pharmacy, verified with patient  - Norgestimate-Ethinyl Estradiol Triphasic (TRI-LO-SPRINTEC) 0.18/0.215/0.25 MG-25 MCG tab; Take 1 tablet by mouth daily.  Dispense: 28  tablet; Refill: 13  5. History of abnormal cervical Pap smear [facility] 08/04/2020, pt did not have repeat pap in 2022  6. Weight loss counseling Discussed eating habits and excercie Recommend portion control, increase protein and reduce carbs.   Exercise ~ 15-20 mins daily.    No follow-ups on file.  No future appointments.  Junious Dresser, FNP

## 2021-12-13 NOTE — Progress Notes (Signed)
Pt here for PE, Pap and BC.  Wet mount results reviewed, no treatment required per Provider.  Rx for OCP's sent to pharmacy by the Provider.  Pt declined condoms.  Berdie Ogren, RN

## 2021-12-14 LAB — IGP, RFX APTIMA HPV ASCU: PAP Smear Comment: 0

## 2021-12-18 ENCOUNTER — Telehealth: Payer: Self-pay

## 2021-12-18 NOTE — Telephone Encounter (Signed)
Calling pt regarding trich present on pap result from 12/13/21.  Pt needs tx appt.  Phone call to pt. Left message on voicemail to please return phone call to RN with [facility], Anabeth Chilcott, at [phone].  Also sent MyChart message 12/18/21.

## 2021-12-18 NOTE — Telephone Encounter (Signed)
Phone call received from pt. Pt confirmed password from last visit. Pt counseled about trich result. Pt scheduled for tx appt for 12/20/21.

## 2021-12-20 NOTE — Telephone Encounter (Signed)
Pt cancelled 12/20/21 appt and made another appt for 12/26/21.

## 2021-12-27 NOTE — Telephone Encounter (Signed)
Pt needed appt for 12/28/21.  Added to provider schedule for 12/28/21.

## 2021-12-28 ENCOUNTER — Ambulatory Visit: Payer: Medicaid Other | Admitting: Family Medicine

## 2021-12-28 ENCOUNTER — Other Ambulatory Visit: Payer: Self-pay

## 2021-12-28 DIAGNOSIS — A599 Trichomoniasis, unspecified: Secondary | ICD-10-CM

## 2021-12-28 DIAGNOSIS — Z113 Encounter for screening for infections with a predominantly sexual mode of transmission: Secondary | ICD-10-CM

## 2021-12-28 DIAGNOSIS — Z3009 Encounter for other general counseling and advice on contraception: Secondary | ICD-10-CM

## 2021-12-28 MED ORDER — METRONIDAZOLE 500 MG PO TABS: 500.0000 mg | ORAL_TABLET | Freq: Two times a day (BID) | ORAL | 0 refills | Status: AC

## 2021-12-28 NOTE — Patient Instructions (Addendum)
It's possible to delay or prevent your period with extended or continuous use of any combined estrogen-progestin birth control pill. Your doctor can recommend the best pill schedule for you, but generally, you skip the inactive pills in your pill pack and start right away on a new pack.  Every 3 months take the 4th row of pills.  This should give you periods 4 times per year.

## 2021-12-31 NOTE — Progress Notes (Signed)
S: Pt in clinic for STI visit , pt was seen on 12/13/21 for PE and pap.   Pap + for [facility].  Pt missed previous appointments for treatment.   Pt denies any new partners since  PE.    O: Pap positive for Trich 12/13/2021   A/P: 1. Trichimoniasis - metroNIDAZOLE (FLAGYL) 500 MG tablet; Take 1 tablet (500 mg total) by mouth 2 (two) times daily for 7 days.  Dispense: 14 tablet; Refill: 0  Pt treated today.  Pt. to RTC for TOC in 4 weeks.  No sex for 14 days.   Pt verbalizes understanding.    2. Birth control counseling Pt desires to take OCP continuously, discussed with patient how to take OCP's to get 4 menses per year.  Pt to RTC when at 13th pack to get additional 3 packs order to complete continuous dosing.    Pt verbalizes understanding .    Junious Dresser, FNP

## 2022-06-03 ENCOUNTER — Ambulatory Visit: Payer: Medicaid Other

## 2022-08-19 ENCOUNTER — Other Ambulatory Visit: Payer: Self-pay

## 2022-08-19 ENCOUNTER — Emergency Department
Admission: EM | Admit: 2022-08-19 | Discharge: 2022-08-19 | Discharge status: Against Medical Advice | Payer: Medicaid Other | Attending: Emergency Medicine | Admitting: Emergency Medicine

## 2022-08-19 DIAGNOSIS — Z5321 Procedure and treatment not carried out due to patient leaving prior to being seen by health care provider: Secondary | ICD-10-CM | POA: Diagnosis not present

## 2022-08-19 DIAGNOSIS — N939 Abnormal uterine and vaginal bleeding, unspecified: Secondary | ICD-10-CM | POA: Insufficient documentation

## 2022-08-19 DIAGNOSIS — R1030 Lower abdominal pain, unspecified: Secondary | ICD-10-CM | POA: Diagnosis not present

## 2022-08-19 LAB — CBC
HCT: 37.4 % (ref 36.0–46.0)
Hemoglobin: 13.1 g/dL (ref 12.0–15.0)
MCH: 29.8 pg (ref 26.0–34.0)
MCHC: 35 g/dL (ref 30.0–36.0)
MCV: 85.2 fL (ref 80.0–100.0)
Platelets: 219 10*3/uL (ref 150–400)
RBC: 4.39 MIL/uL (ref 3.87–5.11)
RDW: 12.7 % (ref 11.5–15.5)
WBC: 9.1 10*3/uL (ref 4.0–10.5)
nRBC: 0 % (ref 0.0–0.2)

## 2022-08-19 NOTE — ED Triage Notes (Signed)
Pt presents via POV c/o vaginal bleeding starting today. Reports she is on her menstrual cycle currently. Reports is passing clots, larger than normal.

## 2022-08-19 NOTE — ED Triage Notes (Signed)
Reports lower abd cramping.

## 2022-09-17 ENCOUNTER — Ambulatory Visit: Payer: Medicaid Other

## 2024-03-25 ENCOUNTER — Emergency Department
Admission: EM | Admit: 2024-03-25 | Discharge: 2024-03-25 | Disposition: A | Discharge status: Home / Self Care | Attending: Emergency Medicine | Admitting: Emergency Medicine

## 2024-03-25 ENCOUNTER — Emergency Department

## 2024-03-25 DIAGNOSIS — M25561 Pain in right knee: Secondary | ICD-10-CM | POA: Diagnosis present

## 2024-03-25 NOTE — ED Provider Triage Note (Signed)
 Emergency Medicine Provider Triage Evaluation Note  Allison Mueller , a 28 y.o. female  was evaluated in triage.  Pt complains of right knee pain after a fall yesterday.  Swollen and tender..  Review of Systems  Positive:  Negative:   Physical Exam  Wt 90.7 kg   BMI 34.32 kg/m  Gen:   Awake, no distress   Resp:  Normal effort  MSK:   Moves extremities without difficulty, crepitus noted at the patella, area is tender at the joint line and patella Other:    Medical Decision Making  Medically screening exam initiated at 1:03 PM.  Appropriate orders placed.  Allison Mueller was informed that the remainder of the evaluation will be completed by another provider, this initial triage assessment does not replace that evaluation, and the importance of remaining in the ED until their evaluation is complete.     Allison Figures, PA-C 03/25/24 1303

## 2024-03-25 NOTE — ED Notes (Signed)
 Pt verbalized understanding as well as demonstrated the use of crutches.

## 2024-03-25 NOTE — Discharge Instructions (Signed)
 Your x-Keaton Stirewalt fortunately did not show signs of a fracture.  You can use the crutches as needed to help.  If your symptoms are not improving within a few days, included information for follow-up with orthopedics.  Return to the ER for new or worsening symptoms.  You can take 1000 mg of Tylenol every 6 hours and 600 mg of ibuprofen  every 6 hours to help with your pain. Do not take more than 4 grams of Tylenol in a day.

## 2024-03-25 NOTE — ED Triage Notes (Signed)
 Fell onto right knee yesterday, c/o right knee pain and swelling.  Has had fluid in knee before.

## 2024-03-25 NOTE — ED Provider Notes (Signed)
 [facility] Provider Note    Event Date/Time   First MD Initiated Contact with Patient 03/25/24 1521     (approximate)   History   Knee Injury   HPI  Allison Mueller is a 28 year old female presenting to the emergency department for evaluation of knee pain.  Patient was try to stand up quickly when her foot got caught causing her to fall onto her right knee.  Did not hit her head.  No LOC.  Not anticoagulation.  Reports pain throughout her right knee.  Denies injury to other areas.  No numbness, tingling, focal weakness.  Able to bear weight, but reports pain with attempts at full flexion.     Physical Exam   Triage Vital Signs: ED Triage Vitals  Encounter Vitals Group     BP 03/25/24 1303 131/77     Systolic BP Percentile --      Diastolic BP Percentile --      Pulse Rate 03/25/24 1303 65     Resp 03/25/24 1303 16     Temp 03/25/24 1303 98.2 F (36.8 C)     Temp Source 03/25/24 1303 Oral     SpO2 03/25/24 1303 99 %     Weight 03/25/24 1302 199 lb 15.3 oz (90.7 kg)     Height --      Head Circumference --      Peak Flow --      Pain Score 03/25/24 1302 7     Pain Loc --      Pain Education --      Exclude from Growth Chart --     Most recent vital signs: Vitals:   03/25/24 1303  BP: 131/77  Pulse: 65  Resp: 16  Temp: 98.2 F (36.8 C)  SpO2: 99%     General: Awake, interactive  CV:  Regular rate, good peripheral perfusion.  Resp:  Unlabored respirations.  Abd:  Nondistended.  Neuro:  Symmetric facial movement, fluid speech MSK:  Mild swelling of the right knee compared to the contralateral side.  2+ DP pulses bilaterally.  Intact sensation.  No tenderness over the bilateral hips, femur, distal lower extremities.  No joint instability.   ED Results / Procedures / Treatments   Labs (all labs ordered are listed, but only abnormal results are displayed) Labs Reviewed - No data to display   EKG EKG independently reviewed  interpreted by myself (ER attending) demonstrates:    RADIOLOGY Imaging independently reviewed and interpreted by myself demonstrates:   X-Josep Luviano without fracture or dislocation  Formal Radiology Read:  DG Knee Complete 4 Views Right Result Date: 03/25/2024 CLINICAL DATA:  Pain after injury.  Swelling.  Fall EXAM: RIGHT KNEE - COMPLETE 5 VIEW COMPARISON:  None Available. FINDINGS: No evidence of fracture, dislocation, or joint effusion. No evidence of arthropathy or other focal bone abnormality. Soft tissues are unremarkable. IMPRESSION: No acute osseous abnormality Electronically Signed   By: Adrianna Horde M.D.   On: 03/25/2024 14:26    PROCEDURES:  Critical Care performed: No  Procedures   MEDICATIONS ORDERED IN ED: Medications - No data to display   IMPRESSION / MDM / ASSESSMENT AND PLAN / ED COURSE  I reviewed the triage vital signs and the nursing notes.  Differential diagnosis includes, but is not limited to, fracture, dislocation, soft tissue injury  Patient's presentation is most consistent with acute complicated illness / injury requiring diagnostic workup.  28 year old female presenting with knee pain.  X-Alichia Alridge reassuring.  Suspect likely strain.  Will place in Ace wrap and provide crutches for use as needed.  Will provide information for follow-up with orthopedics.  Strict return precautions provided.  Patient discharged in stable condition.      FINAL CLINICAL IMPRESSION(S) / ED DIAGNOSES   Final diagnoses:  Acute pain of right knee     Rx / DC Orders   ED Discharge Orders     None        Note:  This document was prepared using Dragon voice recognition software and may include unintentional dictation errors.   Claria Crofts, MD 03/25/24 [phone]

## 2024-08-13 ENCOUNTER — Ambulatory Visit

## 2024-08-17 ENCOUNTER — Ambulatory Visit: Admitting: Family Medicine

## 2024-08-17 DIAGNOSIS — Z113 Encounter for screening for infections with a predominantly sexual mode of transmission: Secondary | ICD-10-CM

## 2024-08-17 LAB — HM HIV SCREENING LAB: HM HIV Screening: NEGATIVE

## 2024-08-17 LAB — WET PREP FOR TRICH, YEAST, CLUE
Clue Cell Exam: NEGATIVE
Trichomonas Exam: NEGATIVE
Yeast Exam: NEGATIVE

## 2024-08-17 NOTE — Progress Notes (Signed)
 Attestation of Attending Supervision of RN:  I was consulted regarding this patient case and agree with the documentation below. [facility] standing orders to be followed.   Dorothyann Helling, MD Clinical Services Medical Director [facility] Department 08/17/24  2:10 PM

## 2024-08-17 NOTE — Patient Instructions (Signed)
 STI screening - Today we obtained a vaginal swab to screen for gonorrhea, chlamydia, and trichomonas - We also obtained a blood sample to screen for HIV and syphilis - If the results are abnormal, I will give you a call.    Estimated time frame for results collected at the [facility] Department: Same day Trichomonas Yeast BV (bacterial vaginosis)  Within 1-2 weeks Gonorrhea Chlamydia  Within 1-2 weeks HIV Syphilis Hepatitis B Hepatitis C

## 2024-08-17 NOTE — Progress Notes (Signed)
 Pt is here STD visit. Wet Prep results reviewed with pt and requires no treatment per SO. Condoms given. Kwadwo Damarri Rampy,RN.

## 2024-12-24 ENCOUNTER — Ambulatory Visit

## 2025-01-07 ENCOUNTER — Ambulatory Visit
